# Patient Record
Sex: Male | Born: 1965 | Race: White | Hispanic: No | Marital: Single
Health system: Southern US, Community
[De-identification: ages and names within clinical notes are randomized; demographics above are authoritative.]

---

## 2000-10-15 ENCOUNTER — Emergency Department (HOSPITAL_COMMUNITY): Admission: EM | Admit: 2000-10-15 | Discharge: 2000-10-15 | Payer: Self-pay | Admitting: *Deleted

## 2000-10-17 ENCOUNTER — Emergency Department (HOSPITAL_COMMUNITY): Admission: EM | Admit: 2000-10-17 | Discharge: 2000-10-17 | Payer: Self-pay | Admitting: Emergency Medicine

## 2003-10-11 ENCOUNTER — Encounter: Admission: RE | Admit: 2003-10-11 | Discharge: 2003-10-11 | Payer: Self-pay | Admitting: Family Medicine

## 2005-02-23 ENCOUNTER — Emergency Department (HOSPITAL_COMMUNITY): Admission: EM | Admit: 2005-02-23 | Discharge: 2005-02-23 | Payer: Self-pay | Admitting: Emergency Medicine

## 2005-02-26 ENCOUNTER — Ambulatory Visit (HOSPITAL_COMMUNITY): Admission: RE | Admit: 2005-02-26 | Discharge: 2005-02-27 | Payer: Self-pay | Admitting: Orthopedic Surgery

## 2006-12-25 IMAGING — CR DG CLAVICLE*L*
2 series · 2 of 2 positions shown · non-contrast
Comparison: none

CLINICAL DATA: ORIF for left clavicle fracture.  
 LEFT CLAVICLE ? 2 VIEW:

[view not recorded (1 of 2)]
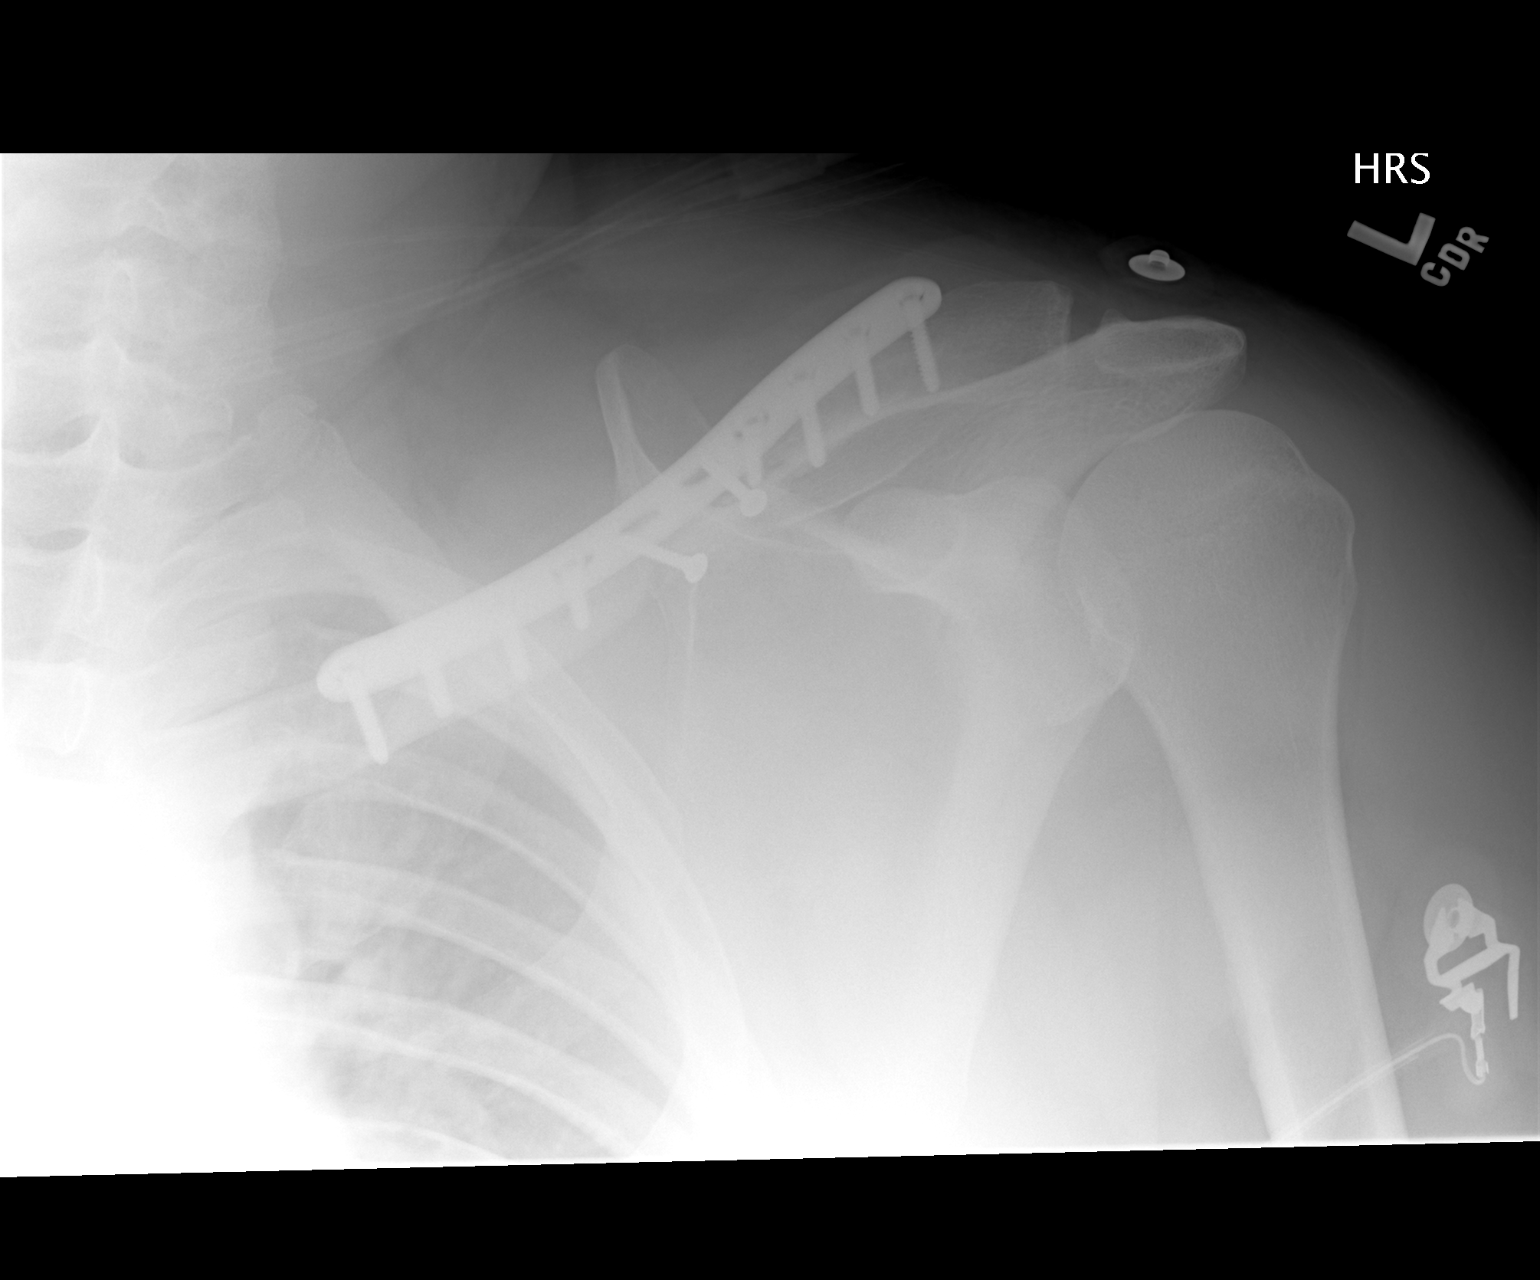

[view not recorded (2 of 2)]
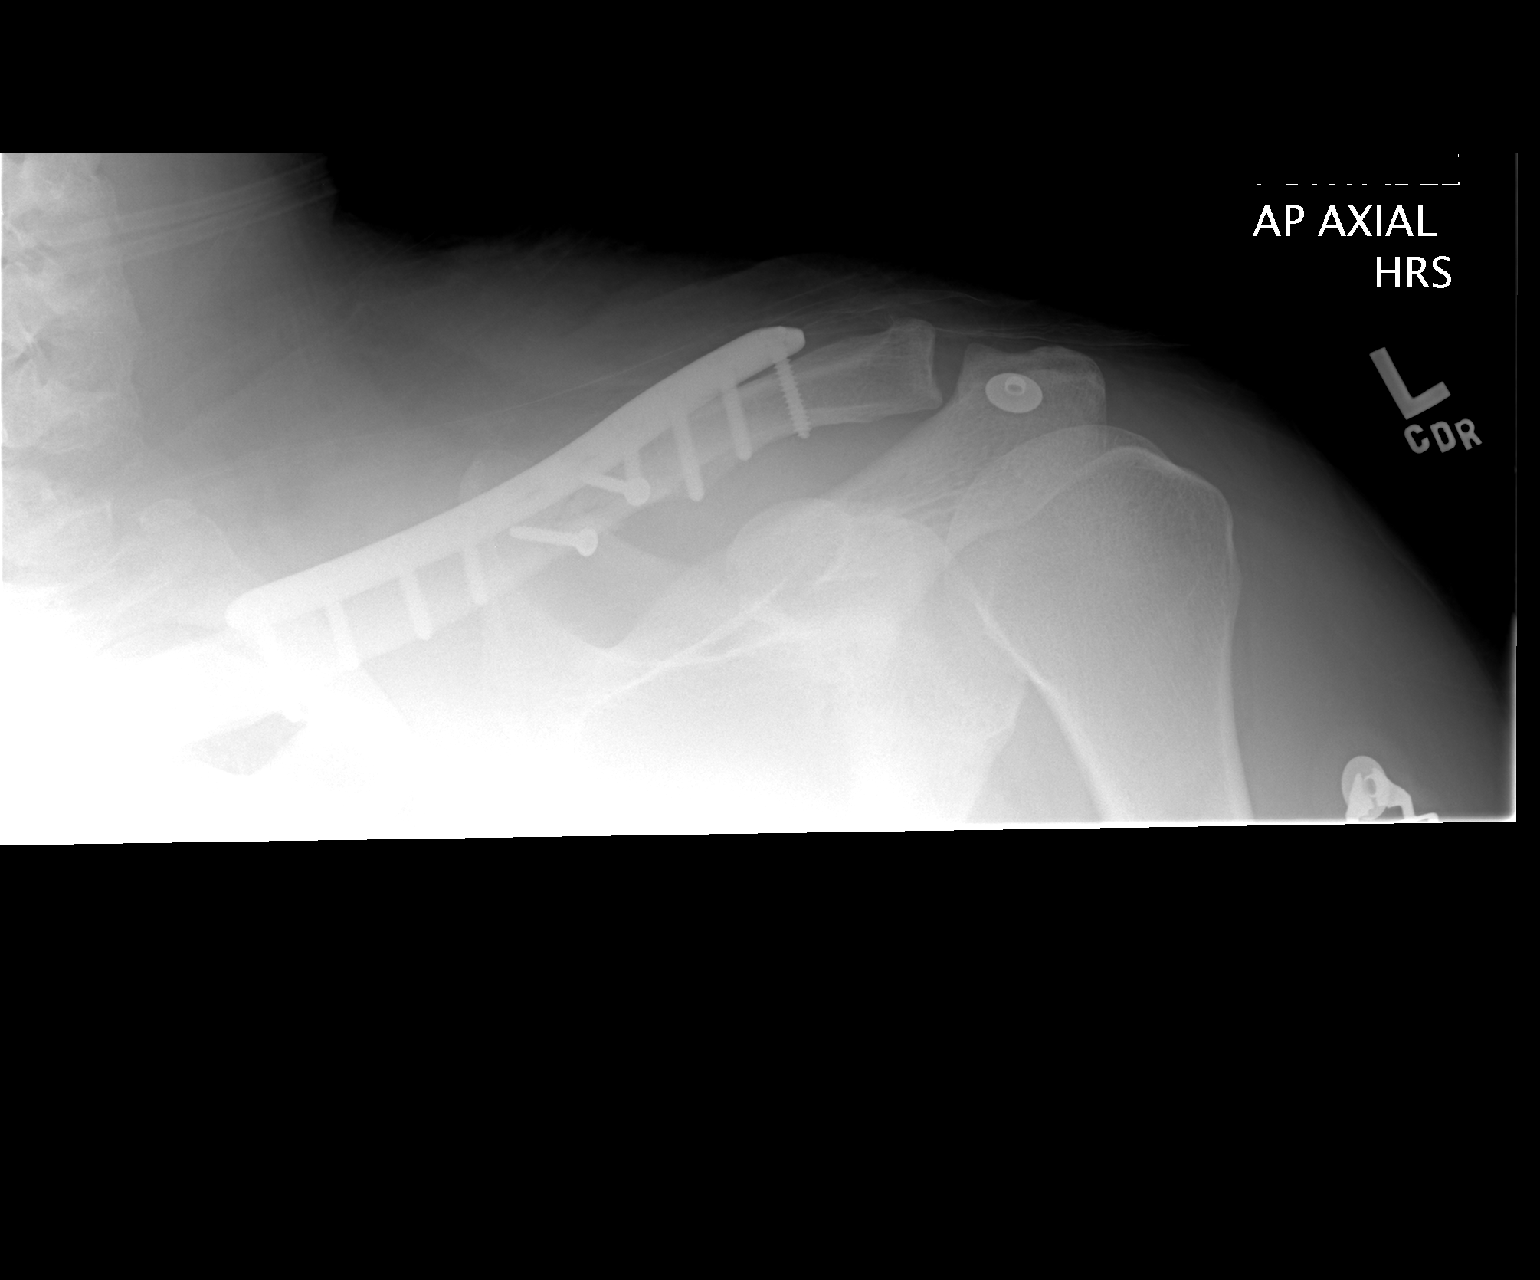

[2 of 2 positions shown; findings below may reference images not displayed]

FINDINGS: Mid to distal clavicle fracture site has been reduced with contour plate and screw fixation hardware.  There is a metallic plate and eight screws traversing the plate and clavicular shaft.  Two separate screws are noted in the midline of the fracture site.  Fracture fragments appear in satisfactory position and alignment.
IMPRESSION: Satisfactory ORIF left clavicle.

## 2008-02-05 ENCOUNTER — Encounter: Admission: RE | Admit: 2008-02-05 | Discharge: 2008-02-05 | Payer: Self-pay | Admitting: Family Medicine

## 2009-06-06 ENCOUNTER — Encounter: Admission: RE | Admit: 2009-06-06 | Discharge: 2009-06-06 | Payer: Self-pay | Admitting: Family Medicine
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
## 2011-04-04 IMAGING — CR DG WRIST COMPLETE 3+V*L*
4 series · 4 of 4 positions shown · non-contrast
Comparison: None.

CLINICAL DATA: Left wrist injury with pain.

LEFT WRIST - COMPLETE 3+ VIEW

[x wrist pa left]
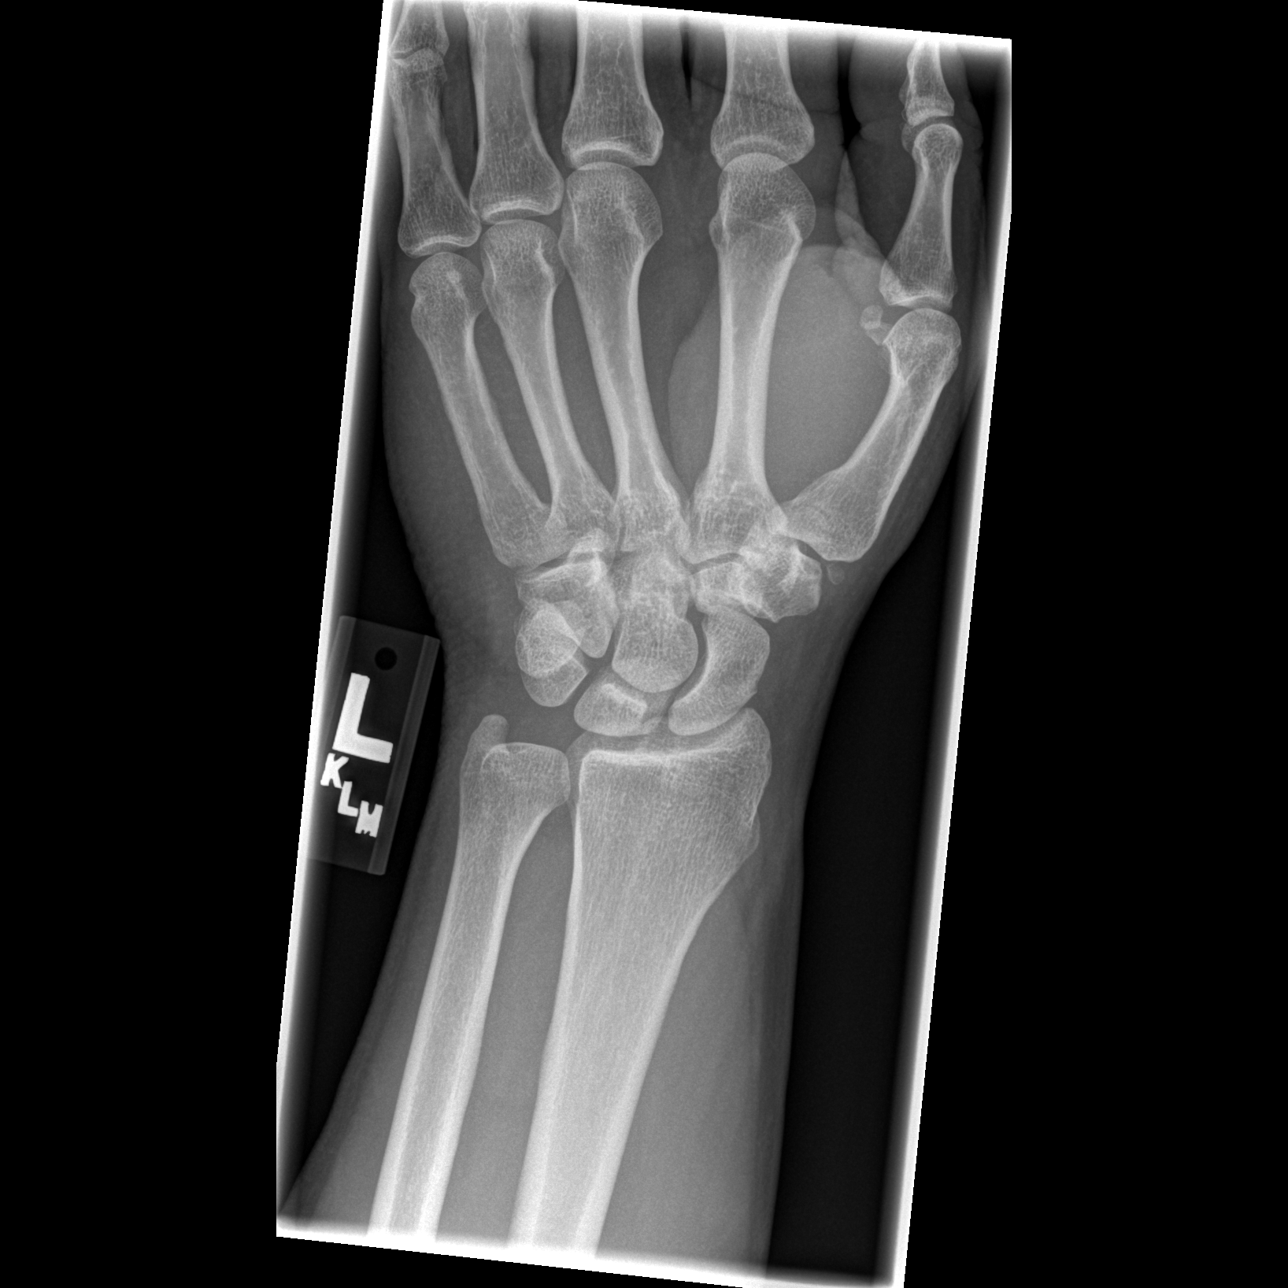

[x wrist obl left]
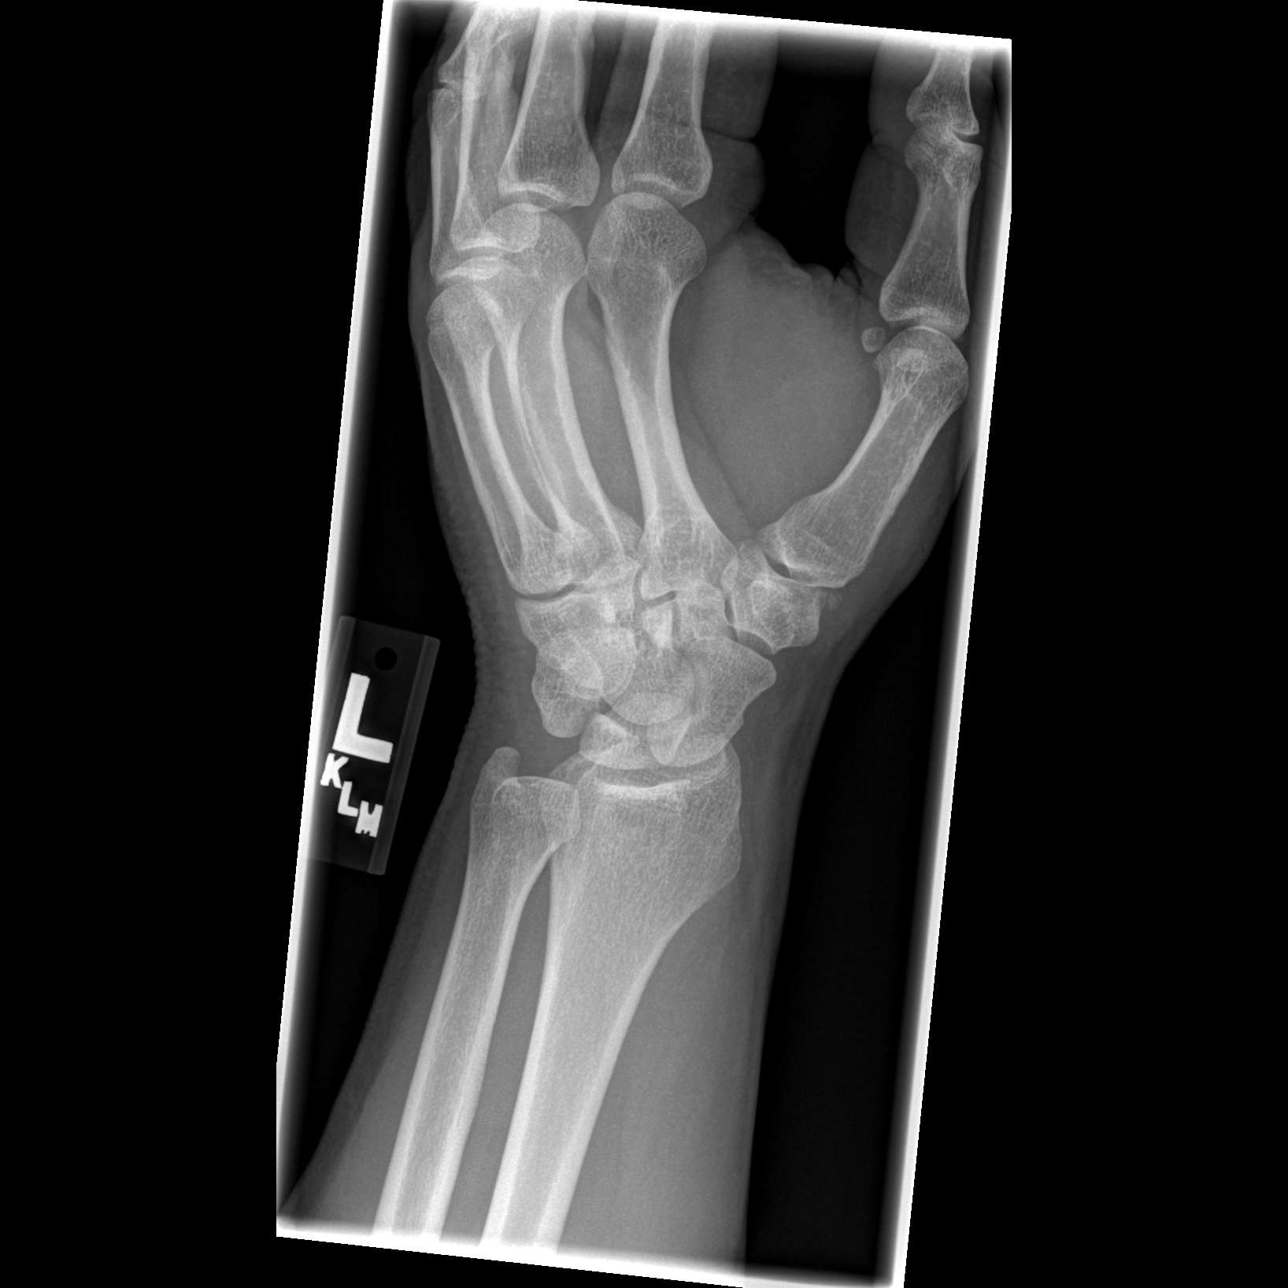

[x wrist lat left]
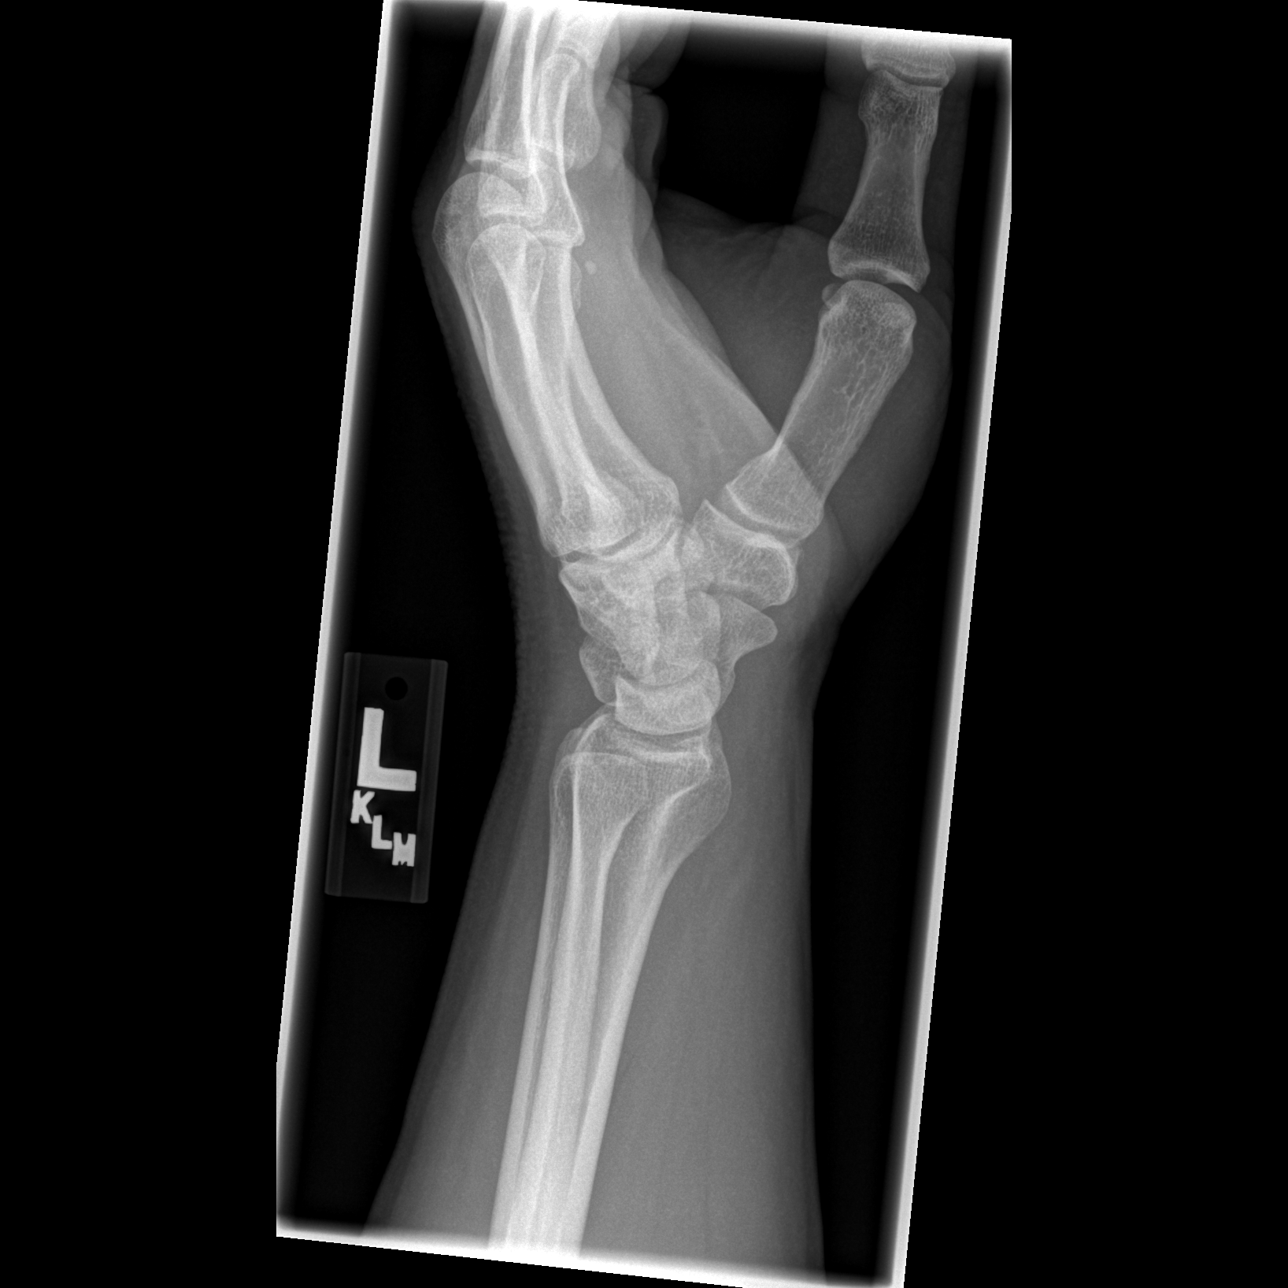

[x navicular]
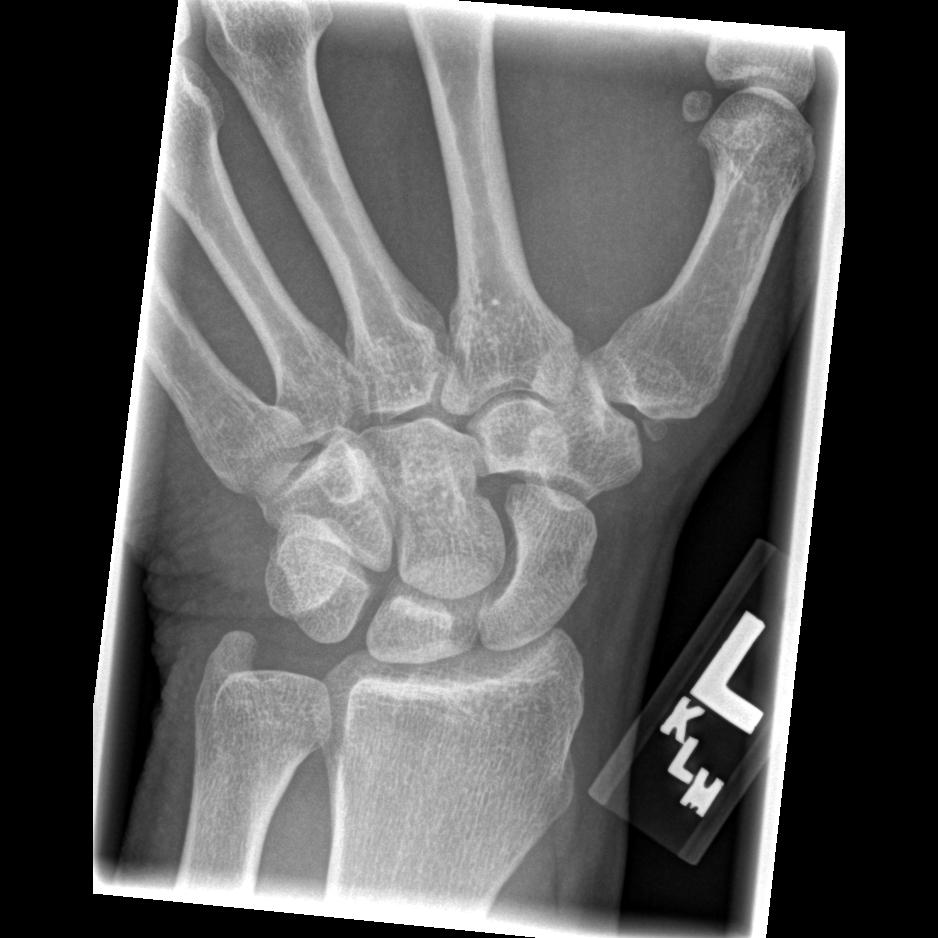

[4 of 4 positions shown; findings below may reference images not displayed]

FINDINGS: No evidence of acute fracture.  Scapholunate interval is
likely at the upper limits of normal in width.  There are mild
degenerative changes at the first carpometacarpal and scaphoid
trapezium trapezoid joints.
IMPRESSION: 1.  No evidence of acute fracture.
2.  Mild degenerative changes in the wrist.

## 2022-06-17 ENCOUNTER — Ambulatory Visit: Payer: Self-pay | Admitting: Surgery

## 2022-06-17 NOTE — H&P (Signed)
Oscar Conley [id number]   Referring Provider:  Evangeline Dakin, PA   Subjective   Chief Complaint: New Consultation (Umbilical hernia)     History of Present Illness:    Very pleasant 57 year old male with history of hypertension, diverticulitis, carpal tunnel syndrome, recent diagnosis of celiac disease, ADD and umbilical hernia presents for consultation regarding the latter.  He states this has been present for his entire life.  In recent years he has had weight fluctuations which he attributes to his previously undiagnosed gluten intolerance and notes that the hernia has increased in size with episodes of weight gain.  It has become slightly more bothersome.  Not painful per se but he is more aware of it.  Since the diagnosis of celiac he has lost 12 or 13 pounds and feels that the hernia that he can feel has decreased somewhat with that weight loss.  He has not had any episodes of incarceration and denies any previous abdominal surgery.  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    Review of Systems: A complete review of systems was obtained from the patient.  I have reviewed this information and discussed as appropriate with the patient.  See HPI as well for other ROS.   Medical History: Past Medical History:  Diagnosis Date   ADHD    Arthritis    Celiac disease ([facility])    Hypertension     There is no problem list on file for this patient.   Past Surgical History:  Procedure Laterality Date   ENDOSCOPIC CARPAL TUNNEL RELEASE  02/04/2022   collar bone reconstruction     right knee arthroscopy repair       No Known Allergies  Current Outpatient Medications on File Prior to Visit  Medication Sig Dispense Refill   losartan (COZAAR) 25 MG tablet Take 25 mg by mouth once daily     cephalexin (KEFLEX) 500 MG capsule TAKE 1 CAPSULE  BY MOUTH 4 TIMES A DAY FOR 7 DAYS (Patient not taking: Reported on 06/17/2022)     No current facility-administered medications on file prior to visit.    Family History  Problem Relation Age of Onset   Colon cancer Mother    Skin cancer Father    High blood pressure (Hypertension) Father    High blood pressure (Hypertension) Brother    Colon cancer Maternal Grandmother      Social History   Tobacco Use  Smoking Status Never  Smokeless Tobacco Never     Social History   Socioeconomic History   Marital status: Married  Tobacco Use   Smoking status: Never   Smokeless tobacco: Never  Vaping Use   Vaping status: Never Used  Substance and Sexual Activity   Alcohol use: Yes    Comment: socially, 1-2 x weekly   Drug use: Never    Objective:    Vitals:   06/17/22 0917 06/17/22 0922  BP: 129/84   Pulse: 55   Temp: 36.6 C (97.9 F)   SpO2: 98%   Weight: (!) 106.6 kg (235 lb)   Height: 180.3 cm (5\' 11" )   PainSc:  0-No pain    Body mass index is 32.78 kg/m.  Gen: A&Ox3, no distress  Unlabored respirations Abdomen is soft, nontender, nondistended.  He has a reducible umbilical hernia with an approximately 2 cm fascial defect.  Assessment  and Plan:  Diagnoses and all orders for this visit:  Umbilical hernia without obstruction and without gangrene    We discussed the history and relevant anatomy as well as options for treatment.  Given his active lifestyle and defect size, I do recommend open repair with mesh and I went over the surgery with him including risks of bleeding, infection, pain, scarring, injury to intra-abdominal structures, poor wound healing or undesired cosmetic result, hematoma/seroma, hernia recurrence.  Discussed postoperative activity limitations, timeline and typical recovery process.  We also discussed the option of ongoing observation, with risks of increasing size or symptoms from the hernia as well as bowel incarceration and we discussed the  signs and symptoms of that that should prompt him to seek emergency treatment.  At this time he would like to proceed with surgery scheduling.  Marajade Lei Carlye Grippe, MD

## 2022-08-31 DIAGNOSIS — R739 Hyperglycemia, unspecified: Secondary | ICD-10-CM | POA: Diagnosis not present

## 2022-11-25 DIAGNOSIS — R7309 Other abnormal glucose: Secondary | ICD-10-CM | POA: Diagnosis not present

## 2022-11-25 DIAGNOSIS — R350 Frequency of micturition: Secondary | ICD-10-CM | POA: Diagnosis not present

## 2022-11-25 DIAGNOSIS — I10 Essential (primary) hypertension: Secondary | ICD-10-CM | POA: Diagnosis not present

## 2023-04-13 DIAGNOSIS — L130 Dermatitis herpetiformis: Secondary | ICD-10-CM | POA: Diagnosis not present

## 2023-05-31 ENCOUNTER — Other Ambulatory Visit (HOSPITAL_BASED_OUTPATIENT_CLINIC_OR_DEPARTMENT_OTHER): Payer: Self-pay | Admitting: Internal Medicine

## 2023-05-31 DIAGNOSIS — K429 Umbilical hernia without obstruction or gangrene: Secondary | ICD-10-CM | POA: Diagnosis not present

## 2023-05-31 DIAGNOSIS — Z0000 Encounter for general adult medical examination without abnormal findings: Secondary | ICD-10-CM | POA: Diagnosis not present

## 2023-05-31 DIAGNOSIS — Z125 Encounter for screening for malignant neoplasm of prostate: Secondary | ICD-10-CM | POA: Diagnosis not present

## 2023-05-31 DIAGNOSIS — E782 Mixed hyperlipidemia: Secondary | ICD-10-CM

## 2023-05-31 DIAGNOSIS — K5730 Diverticulosis of large intestine without perforation or abscess without bleeding: Secondary | ICD-10-CM | POA: Diagnosis not present

## 2023-05-31 DIAGNOSIS — I10 Essential (primary) hypertension: Secondary | ICD-10-CM | POA: Diagnosis not present

## 2023-06-28 ENCOUNTER — Encounter (HOSPITAL_BASED_OUTPATIENT_CLINIC_OR_DEPARTMENT_OTHER): Payer: Self-pay

## 2023-06-28 ENCOUNTER — Other Ambulatory Visit (HOSPITAL_BASED_OUTPATIENT_CLINIC_OR_DEPARTMENT_OTHER): Payer: Self-pay

## 2023-12-06 ENCOUNTER — Other Ambulatory Visit (HOSPITAL_BASED_OUTPATIENT_CLINIC_OR_DEPARTMENT_OTHER): Payer: Self-pay | Admitting: Internal Medicine

## 2023-12-06 DIAGNOSIS — R0609 Other forms of dyspnea: Secondary | ICD-10-CM | POA: Diagnosis not present

## 2023-12-06 DIAGNOSIS — I10 Essential (primary) hypertension: Secondary | ICD-10-CM | POA: Diagnosis not present

## 2023-12-06 DIAGNOSIS — E785 Hyperlipidemia, unspecified: Secondary | ICD-10-CM | POA: Diagnosis not present

## 2023-12-06 DIAGNOSIS — Z23 Encounter for immunization: Secondary | ICD-10-CM | POA: Diagnosis not present

## 2023-12-23 ENCOUNTER — Ambulatory Visit (HOSPITAL_BASED_OUTPATIENT_CLINIC_OR_DEPARTMENT_OTHER)
Admission: RE | Admit: 2023-12-23 | Discharge: 2023-12-23 | Disposition: A | Discharge status: Home / Self Care | Payer: Self-pay | Source: Ambulatory Visit | Attending: Internal Medicine | Admitting: Internal Medicine

## 2023-12-23 DIAGNOSIS — E785 Hyperlipidemia, unspecified: Secondary | ICD-10-CM | POA: Insufficient documentation
